# Patient Record
Sex: Female | Born: 1995 | Race: White | Hispanic: No | Marital: Single | Smoking: Never smoker
Health system: Southern US, Community
[De-identification: ages and names within clinical notes are randomized; demographics above are authoritative.]

## PROBLEM LIST (undated history)

## (undated) ENCOUNTER — Inpatient Hospital Stay (HOSPITAL_COMMUNITY): Payer: Self-pay

## (undated) DIAGNOSIS — B999 Unspecified infectious disease: Secondary | ICD-10-CM

## (undated) DIAGNOSIS — Z789 Other specified health status: Secondary | ICD-10-CM

## (undated) DIAGNOSIS — R51 Headache: Secondary | ICD-10-CM

## (undated) DIAGNOSIS — R519 Headache, unspecified: Secondary | ICD-10-CM

## (undated) HISTORY — PX: CYSTECTOMY: SUR359

---
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
## 2014-08-24 NOTE — L&D Delivery Note (Signed)
Patient is 19 y.o. G2P0010 [redacted]w[redacted]d admitted IOL for non-reassuring antenatal testing.    Delivery Note At 8:51 AM a viable female was delivered via Vaginal, Spontaneous Delivery (Presentation: Middle Occiput Anterior).  APGAR: 8, 9; weight pending .   Placenta status: Intact, Spontaneous.  Cord: 3 vessels with the following complications: None.   Anesthesia: Epidural  Episiotomy: None Lacerations: Labial Suture Repair: vicryl 4.0  Mom to postpartum.  Baby to Couplet care / Skin to Skin.  Tarri Abernethy 04/18/2015, 9:37 AM    Upon arrival patient was complete and pushing. She pushed with good maternal effort to deliver a healthy baby girl. Baby delivered without difficulty, was noted to have good tone and place on maternal abdomen for oral suctioning, drying and stimulation. Delayed cord clamping performed. Placenta delivered intact with 3V cord. Vaginal canal and perineum was inspected; labial laceration repaired with 4.0 vicryl. Pitocin was started and uterus massaged until bleeding slowed. Counts of sharps, instruments, and lap pads were all correct.   Tarri Abernethy, MD PGY-1 [facility] Family Medicine     I was gloved and present for entire delivery Agree with note No difficulty with shoulders Laceration repaired with my assistance Baby and mom stable Aviva Signs, CNM

## 2014-10-15 LAB — OB RESULTS CONSOLE ANTIBODY SCREEN: Antibody Screen: NEGATIVE

## 2014-10-15 LAB — OB RESULTS CONSOLE GC/CHLAMYDIA
CHLAMYDIA, DNA PROBE: NEGATIVE
Gonorrhea: NEGATIVE

## 2014-10-15 LAB — OB RESULTS CONSOLE HIV ANTIBODY (ROUTINE TESTING): HIV: NONREACTIVE

## 2014-10-15 LAB — OB RESULTS CONSOLE RUBELLA ANTIBODY, IGM: Rubella: IMMUNE

## 2014-10-15 LAB — OB RESULTS CONSOLE ABO/RH: RH TYPE: POSITIVE

## 2014-10-15 LAB — OB RESULTS CONSOLE HEPATITIS B SURFACE ANTIGEN: HEP B S AG: NEGATIVE

## 2014-10-15 LAB — OB RESULTS CONSOLE RPR: RPR: NONREACTIVE

## 2015-02-10 ENCOUNTER — Encounter (HOSPITAL_COMMUNITY): Payer: Self-pay | Admitting: *Deleted

## 2015-02-10 ENCOUNTER — Inpatient Hospital Stay (HOSPITAL_COMMUNITY)
Admission: AD | Admit: 2015-02-10 | Discharge: 2015-02-10 | Disposition: A | Discharge status: Home / Self Care | Payer: Medicaid Other | Source: Ambulatory Visit | Attending: Obstetrics & Gynecology | Admitting: Obstetrics & Gynecology

## 2015-02-10 DIAGNOSIS — Z3A31 31 weeks gestation of pregnancy: Secondary | ICD-10-CM | POA: Diagnosis not present

## 2015-02-10 DIAGNOSIS — R51 Headache: Secondary | ICD-10-CM | POA: Diagnosis not present

## 2015-02-10 DIAGNOSIS — O26893 Other specified pregnancy related conditions, third trimester: Secondary | ICD-10-CM

## 2015-02-10 DIAGNOSIS — O9989 Other specified diseases and conditions complicating pregnancy, childbirth and the puerperium: Secondary | ICD-10-CM | POA: Diagnosis present

## 2015-02-10 DIAGNOSIS — R519 Headache, unspecified: Secondary | ICD-10-CM

## 2015-02-10 HISTORY — DX: Other specified health status: Z78.9

## 2015-02-10 LAB — URINALYSIS, ROUTINE W REFLEX MICROSCOPIC
Bilirubin Urine: NEGATIVE
Glucose, UA: NEGATIVE mg/dL
Hgb urine dipstick: NEGATIVE
Ketones, ur: NEGATIVE mg/dL
Nitrite: NEGATIVE
Protein, ur: NEGATIVE mg/dL
SPECIFIC GRAVITY, URINE: 1.01 (ref 1.005–1.030)
Urobilinogen, UA: 0.2 mg/dL (ref 0.0–1.0)
pH: 5.5 (ref 5.0–8.0)

## 2015-02-10 LAB — URINE MICROSCOPIC-ADD ON

## 2015-02-10 MED ORDER — BUTALBITAL-APAP-CAFFEINE 50-325-40 MG PO TABS: 1.0000 | ORAL_TABLET | Freq: Four times a day (QID) | ORAL | Status: DC | PRN

## 2015-02-10 MED ORDER — BUTALBITAL-APAP-CAFFEINE 50-325-40 MG PO TABS
2.0000 | ORAL_TABLET | Freq: Once | ORAL | Status: AC
  Administered 2015-02-10: 2 via ORAL
  Filled 2015-02-10: qty 2

## 2015-02-10 NOTE — Discharge Instructions (Signed)
Atypical Migraine CAUSES  When migraine affects the circulation in back of the brain or neck, it can cause atpical migraine.  SYMPTOMS  It occurs most frequently in young women. Symptoms include:  Dizziness.  Double vision.  Loss of balance.  Confusion.  Slurred speech.  Fainting.  Disorientation.  During the severe (acute) headache, some people lose consciousness. Often these patients are mistakenly thought to be intoxicated, under the influence of drugs, or suffering from other conditions. A previous history of migraine is helpful in making the diagnosis.  TREATMENT  Basilar migraines are treated medically the same as all other migraines. HOME CARE INSTRUCTIONS  1. If this is your first diagnosed migraine headache, you may simply choose to wait and watch. You can wait to see if you have another headache before deciding on a further treatment. 2. You may consult your caregiver or do as suggested by the current treating caregiver. 3. Numerous medications can prevent these headaches if they are recurrent or should they become recurrent. Your caregiver can help you with a medication or treatment program that will be helpful to you. 4. If this has been a chronic (long-standing) condition, using continuous narcotics is not recommended. Using long-term narcotics can cause recurrent migraines. Narcotics are a temporary measure only. They are used for the infrequent migraine that fails to respond to all other measures. SEEK IMMEDIATE MEDICAL CARE IF:   You do not get relief from the medications given to you or you have a recurrence of pain.  You have an unexplained oral temperature above 102 F (38.9 C), or as your caregiver suggests.  You have a stiff neck.  You have loss of vision.  You have muscular weakness.  You have loss of muscular control.  You develop severe symptoms different from your first symptoms.  You start losing your balance or have trouble walking.  You feel  faint or pass out. MAKE SURE YOU:   Understand these instructions.  Will watch your condition.  Will get help right away if you are not doing well or get worse. Document Released: 08/10/2005 Document Revised: 11/02/2011 Document Reviewed: 03/28/2008 [facility] Patient Information 2015 [place], [place]. This information is not intended to replace advice given to you by your health care provider. Make sure you discuss any questions you have with your health care provider.  Fetal Movement Counts Patient Name: __________________________________________________ Patient Due Date: ____________________ Performing a fetal movement count is highly recommended in high-risk pregnancies, but it is good for every pregnant woman to do. Your health care provider may ask you to start counting fetal movements at 28 weeks of the pregnancy. Fetal movements often increase:  After eating a full meal.  After physical activity.  After eating or drinking something sweet or cold.  At rest. Pay attention to when you feel the baby is most active. This will help you notice a pattern of your baby's sleep and wake cycles and what factors contribute to an increase in fetal movement. It is important to perform a fetal movement count at the same time each day when your baby is normally most active.  HOW TO COUNT FETAL MOVEMENTS 5. Find a quiet and comfortable area to sit or lie down on your left side. Lying on your left side provides the best blood and oxygen circulation to your baby. 6. Write down the day and time on a sheet of paper or in a journal. 7. Start counting kicks, flutters, swishes, rolls, or jabs in a 2-hour period. You should  feel at least 10 movements within 2 hours. 8. If you do not feel 10 movements in 2 hours, wait 2-3 hours and count again. Look for a change in the pattern or not enough counts in 2 hours. SEEK MEDICAL CARE IF:  You feel less than 10 counts in 2 hours, tried twice.  There is no movement  in over an hour.  The pattern is changing or taking longer each day to reach 10 counts in 2 hours.  You feel the baby is not moving as he or she usually does. Date: ____________ Movements: ____________ Start time: ____________ Doreatha Martin time: ____________  Date: ____________ Movements: ____________ Start time: ____________ Doreatha Martin time: ____________ Date: ____________ Movements: ____________ Start time: ____________ Doreatha Martin time: ____________ Date: ____________ Movements: ____________ Start time: ____________ Doreatha Martin time: ____________ Date: ____________ Movements: ____________ Start time: ____________ Doreatha Martin time: ____________ Date: ____________ Movements: ____________ Start time: ____________ Doreatha Martin time: ____________ Date: ____________ Movements: ____________ Start time: ____________ Doreatha Martin time: ____________ Date: ____________ Movements: ____________ Start time: ____________ Doreatha Martin time: ____________  Date: ____________ Movements: ____________ Start time: ____________ Doreatha Martin time: ____________ Date: ____________ Movements: ____________ Start time: ____________ Doreatha Martin time: ____________ Date: ____________ Movements: ____________ Start time: ____________ Doreatha Martin time: ____________ Date: ____________ Movements: ____________ Start time: ____________ Doreatha Martin time: ____________ Date: ____________ Movements: ____________ Start time: ____________ Doreatha Martin time: ____________ Date: ____________ Movements: ____________ Start time: ____________ Doreatha Martin time: ____________ Date: ____________ Movements: ____________ Start time: ____________ Doreatha Martin time: ____________  Date: ____________ Movements: ____________ Start time: ____________ Doreatha Martin time: ____________ Date: ____________ Movements: ____________ Start time: ____________ Doreatha Martin time: ____________ Date: ____________ Movements: ____________ Start time: ____________ Doreatha Martin time: ____________ Date: ____________ Movements: ____________ Start time: ____________  Doreatha Martin time: ____________ Date: ____________ Movements: ____________ Start time: ____________ Doreatha Martin time: ____________ Date: ____________ Movements: ____________ Start time: ____________ Doreatha Martin time: ____________ Date: ____________ Movements: ____________ Start time: ____________ Doreatha Martin time: ____________  Date: ____________ Movements: ____________ Start time: ____________ Doreatha Martin time: ____________ Date: ____________ Movements: ____________ Start time: ____________ Doreatha Martin time: ____________ Date: ____________ Movements: ____________ Start time: ____________ Doreatha Martin time: ____________ Date: ____________ Movements: ____________ Start time: ____________ Doreatha Martin time: ____________ Date: ____________ Movements: ____________ Start time: ____________ Doreatha Martin time: ____________ Date: ____________ Movements: ____________ Start time: ____________ Doreatha Martin time: ____________ Date: ____________ Movements: ____________ Start time: ____________ Doreatha Martin time: ____________  Date: ____________ Movements: ____________ Start time: ____________ Doreatha Martin time: ____________ Date: ____________ Movements: ____________ Start time: ____________ Doreatha Martin time: ____________ Date: ____________ Movements: ____________ Start time: ____________ Doreatha Martin time: ____________ Date: ____________ Movements: ____________ Start time: ____________ Doreatha Martin time: ____________ Date: ____________ Movements: ____________ Start time: ____________ Doreatha Martin time: ____________ Date: ____________ Movements: ____________ Start time: ____________ Doreatha Martin time: ____________ Date: ____________ Movements: ____________ Start time: ____________ Doreatha Martin time: ____________  Date: ____________ Movements: ____________ Start time: ____________ Doreatha Martin time: ____________ Date: ____________ Movements: ____________ Start time: ____________ Doreatha Martin time: ____________ Date: ____________ Movements: ____________ Start time: ____________ Doreatha Martin time: ____________ Date: ____________  Movements: ____________ Start time: ____________ Doreatha Martin time: ____________ Date: ____________ Movements: ____________ Start time: ____________ Doreatha Martin time: ____________ Date: ____________ Movements: ____________ Start time: ____________ Doreatha Martin time: ____________ Date: ____________ Movements: ____________ Start time: ____________ Doreatha Martin time: ____________  Date: ____________ Movements: ____________ Start time: ____________ Doreatha Martin time: ____________ Date: ____________ Movements: ____________ Start time: ____________ Doreatha Martin time: ____________ Date: ____________ Movements: ____________ Start time: ____________ Doreatha Martin time: ____________ Date: ____________ Movements: ____________ Start time: ____________ Doreatha Martin time: ____________ Date: ____________ Movements: ____________ Start time: ____________ Doreatha Martin time: ____________ Date: ____________ Movements: ____________ Start time: ____________ Doreatha Martin time: ____________ Date: ____________ Movements: ____________ Start  time: ____________ Doreatha Martin time: ____________  Date: ____________ Movements: ____________ Start time: ____________ Doreatha Martin time: ____________ Date: ____________ Movements: ____________ Start time: ____________ Doreatha Martin time: ____________ Date: ____________ Movements: ____________ Start time: ____________ Doreatha Martin time: ____________ Date: ____________ Movements: ____________ Start time: ____________ Doreatha Martin time: ____________ Date: ____________ Movements: ____________ Start time: ____________ Doreatha Martin time: ____________ Date: ____________ Movements: ____________ Start time: ____________ Doreatha Martin time: ____________ Document Released: 09/09/2006 Document Revised: 12/25/2013 Document Reviewed: 06/06/2012 [employer] Patient Information 2015 [employer]. This information is not intended to replace advice given to you by your health care provider. Make sure you discuss any questions you have with your health care provider.

## 2015-02-10 NOTE — MAU Note (Signed)
Patient states that she had a panic / anxiety attack last night and could have hyperventilated.

## 2015-02-10 NOTE — MAU Provider Note (Signed)
Chief Complaint:  Migraine; Numbness; and Eye Problem   First Provider Initiated Contact with Patient 02/10/15 1557      HPI: Chelsea Rogers is a 19 y.o. G2P0010 at [redacted]w[redacted]d who presents to maternity admissions reporting possible migraine HA and blurry vision last night and this morning. HA's were preceded by various brief episodes of numbness in hands, tongue, inner calf on both sides if the body. When Sx started last light she got very panicked and started hyperventilating. Sx worsened after that, but resolved w/ sleep. 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   She rates her HA 5/10 on pain scale. Describes it as sharp, right-sided. Did not try anything to Tx the HA. Has Hx of frequent HA's, but none during the pregnancy. Has never been evaluated for HA's. Does not have PCP.    Denies contractions, leakage of fluid or vaginal bleeding. Good fetal movement.   Pregnancy Course: Uncomplicated. Getting care at [facility] in [place], btu is switching to [place].    Past Medical History: Past Medical History  Diagnosis Date  . Medical history non-contributory     Past obstetric history: OB History  Gravida Para Term Preterm AB SAB TAB Ectopic Multiple Living  2 0   1 1    0    # Outcome Date GA Lbr Len/2nd Weight Sex Delivery Anes PTL Lv  2 Current           1 SAB 11/07/13              Past Surgical History: Past Surgical History  Procedure Laterality Date  . Cyst removal hand  2014    PILONIDAL     Family History: History reviewed. No pertinent family history.  Social History: History  Substance Use Topics  . Smoking status: Never Smoker   . Smokeless tobacco: Never Used  . Alcohol Use: No    Allergies: No Known Allergies  Meds:  Prescriptions prior to admission  Medication Sig Dispense Refill Last Dose  . Prenatal Vit-Fe Fumarate-FA (PRENATAL MULTIVITAMIN) TABS tablet Take 1 tablet by mouth daily.   02/10/2015 at  Unknown time    ROS:  Review of Systems  Constitutional: Negative for fever and chills.  HENT: Negative for congestion and ear pain.   Eyes: Positive for blurred vision and photophobia. Negative for double vision and pain.  Gastrointestinal: Negative for nausea, vomiting and abdominal pain.  Genitourinary:       Neg for VB or LOF  Musculoskeletal: Negative for falls and neck pain.  Neurological: Positive for tingling, sensory change, speech change (one episode of difficulty "finding her words" last night. No problems since then. ) and headaches. Negative for dizziness, focal weakness, seizures, loss of consciousness and weakness.  Psychiatric/Behavioral: The patient is nervous/anxious.     Physical Exam  Blood pressure 120/66, pulse 72, temperature 98.2 F (36.8 C), temperature source Oral, resp. rate 16, last menstrual period 07/08/2014. GENERAL: Well-developed, well-nourished female in no acute distress.  HEAD: Symmetric facial mvmts.  HEART: normal rate RESP: normal effort GI: Abd soft, non-tender, gravid appropriate for gestational age.  MS: Extremities nontender, no edema, normal ROM.  NEURO: Alert and oriented x 4. Grossly normal sensation of bilat upper and lower extremities and face. Symmetric facial mvmt. Normal speech and gait.  PSYCHIATRIC: Normal mood and affect  GU: Deferred   FHT:  Baseline 130 , moderate variability, accelerations present, no decelerations Contractions: none  Labs: Results for orders placed or performed during the hospital encounter of 02/10/15 (from the past 24 hour(s))  Urinalysis, Routine w reflex microscopic (not at [facility])     Status: Abnormal   Collection Time: 02/10/15  4:10 PM  Result Value Ref Range   Color, Urine YELLOW YELLOW   APPearance CLEAR CLEAR   Specific Gravity, Urine 1.010 1.005 - 1.030   pH 5.5 5.0 - 8.0   Glucose, UA NEGATIVE NEGATIVE mg/dL   Hgb urine dipstick NEGATIVE NEGATIVE   Bilirubin Urine NEGATIVE NEGATIVE    Ketones, ur NEGATIVE NEGATIVE mg/dL   Protein, ur NEGATIVE NEGATIVE mg/dL   Urobilinogen, UA 0.2 0.0 - 1.0 mg/dL   Nitrite NEGATIVE NEGATIVE   Leukocytes, UA SMALL (A) NEGATIVE  Urine microscopic-add on     Status: Abnormal   Collection Time: 02/10/15  4:10 PM  Result Value Ref Range   Squamous Epithelial / LPF FEW (A) RARE   WBC, UA 3-6 <3 WBC/hpf   Bacteria, UA FEW (A) RARE   Urine-Other MUCOUS PRESENT     Imaging:  No results found.  MAU Course: UA, Fioricet, PO fluids. Discussed pt w/ Dr. Macon Large. Very low suspicion for stroke due to brief episodes of numbness of both sides of the body. May be either R/T panic attack last night or atypical migraine. Also low suspicion for Pre-E due to normal BP, absence of proteinuria.    HA 1/10 on pain scale. Pt laughing w/ family when CNM returned. Lights on. Pt in NAD. Reports one brief episode of numbness of left palm while in MAU that has since resolved. Support person notes that pt had been texting on her phone for a while before the numbness occurred. Pt became anxious again. Discussed that there is no apparent neuro emergency, no indication for head CT at this time, but that pt could F/U w/ Neuro OP. Hand numbness may he carple tunnel syndrome.   Assessment: 1. Headache in pregnancy, antepartum, third trimester    Plan: Discharge home in stable condition per consult w/ Dr. Macon Large.  Preterm labor precautions and fetal kick counts. Stroke and Pre-E precautions.  HA comfort measures.      Follow-up Information    Follow up with [facility] [place].   Why:  Routine prenatal visit   Contact information:   [address] [place] [address] [phone]      Follow up with [facility] [place] [facility].   Why:  As needed in emergencies   Contact information:   [address] [id number] mc [place] [address] [phone]        Medication List    TAKE these  medications        butalbital-acetaminophen-caffeine 50-325-40 MG per tablet  Commonly known as:  FIORICET  Take 1-2 tablets by mouth every 6 (six) hours as needed for headache.     prenatal multivitamin Tabs tablet  Take 1 tablet by mouth daily.        [place], CNM 02/10/2015 5:40 PM

## 2015-02-10 NOTE — MAU Note (Signed)
Patient presents at [redacted] weeks gestation with c/o a migraine headache last night and for a couple of hours today; right hand / left leg / foot / face / tongue numbness and visual spots last night and left hand / face / tongue numbness and visual spots today. States fetus is active. Denies bleeding and fluid gushes.

## 2015-03-21 LAB — OB RESULTS CONSOLE GBS: GBS: NEGATIVE

## 2015-04-15 ENCOUNTER — Inpatient Hospital Stay (HOSPITAL_COMMUNITY)
Admission: AD | Admit: 2015-04-15 | Discharge: 2015-04-15 | Disposition: A | Discharge status: Home / Self Care | Payer: Medicaid Other | Source: Ambulatory Visit | Attending: Family Medicine | Admitting: Family Medicine

## 2015-04-15 ENCOUNTER — Other Ambulatory Visit (HOSPITAL_COMMUNITY): Payer: Self-pay | Admitting: Physician Assistant

## 2015-04-15 ENCOUNTER — Encounter (HOSPITAL_COMMUNITY): Payer: Self-pay

## 2015-04-15 DIAGNOSIS — O26893 Other specified pregnancy related conditions, third trimester: Secondary | ICD-10-CM | POA: Insufficient documentation

## 2015-04-15 DIAGNOSIS — O480 Post-term pregnancy: Secondary | ICD-10-CM

## 2015-04-15 DIAGNOSIS — Z3A40 40 weeks gestation of pregnancy: Secondary | ICD-10-CM | POA: Insufficient documentation

## 2015-04-15 DIAGNOSIS — Z79899 Other long term (current) drug therapy: Secondary | ICD-10-CM | POA: Diagnosis not present

## 2015-04-15 DIAGNOSIS — N898 Other specified noninflammatory disorders of vagina: Secondary | ICD-10-CM | POA: Insufficient documentation

## 2015-04-15 DIAGNOSIS — O4693 Antepartum hemorrhage, unspecified, third trimester: Secondary | ICD-10-CM

## 2015-04-15 NOTE — MAU Note (Signed)
Pt reports gush of clear water at 1700,  ?contractions.

## 2015-04-15 NOTE — MAU Provider Note (Signed)
History     CSN: [id number]  Arrival date and time: 04/15/15 9563   First Provider Initiated Contact with Patient 04/15/15 2240      No chief complaint on file.  This is a 19 y.o. female at [redacted]w[redacted]d who presents for possible leaking of membranes. States had a gush of clear water at 5pm.  Happened only once. Feels an occasional contraction, not sure. Denies bleeding. Reports good fetal movement.    Vaginal Discharge The patient's primary symptoms include vaginal discharge. This is a new problem. The current episode started today. The problem has been resolved. She is pregnant. Pertinent negatives include no abdominal pain, back pain, chills, constipation, diarrhea, dysuria, fever, nausea or vomiting. The vaginal discharge was clear and watery. There has been no bleeding. She has not been passing clots. She has not been passing tissue. Nothing aggravates the symptoms. She has tried nothing for the symptoms.   RN Note:  Expand All Collapse All   Pt reports gush of clear water at 1700, ?contractions.           OB History    Gravida Para Term Preterm AB TAB SAB Ectopic Multiple Living   0 1      Past Medical History  Diagnosis Date  . Medical history non-contributory     Past Surgical History  Procedure Laterality Date  . Cyst removal hand  2014    PILONIDAL    History reviewed. No pertinent family history.  Social History  Substance Use Topics  . Smoking status: Never Smoker   . Smokeless tobacco: Never Used  . Alcohol Use: No    Allergies: No Known Allergies  Prescriptions prior to admission  Medication Sig Dispense Refill Last Dose  . acetaminophen (TYLENOL) 500 MG tablet Take 1,000 mg by mouth every 6 (six) hours as needed for headache.    Past Month at Unknown time  . amoxicillin (AMOXIL) 500 MG capsule Take 500 mg by mouth 3 (three) times daily.   04/15/2015 at Unknown time  . cetirizine-pseudoephedrine (ZYRTEC-D) 5-120 MG per tablet Take 1  tablet by mouth daily.   Past Week at Unknown time  . Prenatal Vit-Fe Fumarate-FA (PRENATAL MULTIVITAMIN) TABS tablet Take 1 tablet by mouth daily.   04/15/2015 at Unknown time  . butalbital-acetaminophen-caffeine (FIORICET) 50-325-40 MG per tablet Take 1-2 tablets by mouth every 6 (six) hours as needed for headache. (Patient not taking: Reported on 04/15/2015) 20 tablet 1 Not Taking at Unknown time    Review of Systems  Constitutional: Negative for fever, chills and malaise/fatigue.  Gastrointestinal: Negative for nausea, vomiting, abdominal pain, diarrhea and constipation.  Genitourinary: Positive for vaginal discharge. Negative for dysuria.  Musculoskeletal: Negative for myalgias, back pain and neck pain.  Neurological: Negative for dizziness.  Other systems negative  Physical Exam   Blood pressure 132/72, pulse 88, temperature 98.3 F (36.8 C), temperature source Oral, resp. rate 15, height  (1.626 m), weight 151 lb (68.493 kg), last menstrual period 07/08/2014, SpO2 98 %.  Physical Exam  Constitutional: She is oriented to person, place, and time. She appears well-developed and well-nourished. No distress.  HENT:  Head: Normocephalic.  Cardiovascular: Normal rate and regular rhythm.   Respiratory: Effort normal. No respiratory distress.  GI: Soft. She exhibits no distension and no mass. There is no tenderness. There is no rebound and no guarding.  Genitourinary:  Sterile speculum exam:  NO pooling or ferning Cervix 1/70/-2 with no change  after one hour  Musculoskeletal: Normal range of motion.  Neurological: She is alert and oriented to person, place, and time.  Skin: Skin is warm and dry.  Psychiatric: She has a normal mood and affect.  FHR reactive Occasional contractions  MAU Course  Procedures  MDM Sterile speculum exam done which ruled out rupture of membranes  Assessment and Plan  A:  SIUP at [redacted]w[redacted]d       Suspected leaking of fluid, no evidence of ruptured  membranes       Not in labor       Reassuring fetal heart rate tracing, Category I  P:   Discharge home       Labor precautions  [facility] 04/15/2015, 10:40 PM

## 2015-04-15 NOTE — Discharge Instructions (Signed)
Braxton Hicks Contractions °Contractions of the uterus can occur throughout pregnancy. Contractions are not always a sign that you are in labor.  °WHAT ARE BRAXTON HICKS CONTRACTIONS?  °Contractions that occur before labor are called Braxton Hicks contractions, or false labor. Toward the end of pregnancy (32-34 weeks), these contractions can develop more often and may become more forceful. This is not true labor because these contractions do not result in opening (dilatation) and thinning of the cervix. They are sometimes difficult to tell apart from true labor because these contractions can be forceful and people have different pain tolerances. You should not feel embarrassed if you go to the hospital with false labor. Sometimes, the only way to tell if you are in true labor is for your health care provider to look for changes in the cervix. °If there are no prenatal problems or other health problems associated with the pregnancy, it is completely safe to be sent home with false labor and await the onset of true labor. °HOW CAN YOU TELL THE DIFFERENCE BETWEEN TRUE AND FALSE LABOR? °False Labor °· The contractions of false labor are usually shorter and not as hard as those of true labor.   °· The contractions are usually irregular.   °· The contractions are often felt in the front of the lower abdomen and in the groin.   °· The contractions may go away when you walk around or change positions while lying down.   °· The contractions get weaker and are shorter lasting as time goes on.   °· The contractions do not usually become progressively stronger, regular, and closer together as with true labor.   °True Labor °1. Contractions in true labor last 30-70 seconds, become very regular, usually become more intense, and increase in frequency.   °2. The contractions do not go away with walking.   °3. The discomfort is usually felt in the top of the uterus and spreads to the lower abdomen and low back.   °4. True labor can  be determined by your health care provider with an exam. This will show that the cervix is dilating and getting thinner.   °WHAT TO REMEMBER °· Keep up with your usual exercises and follow other instructions given by your health care provider.   °· Take medicines as directed by your health care provider.   °· Keep your regular prenatal appointments.   °· Eat and drink lightly if you think you are going into labor.   °· If Braxton Hicks contractions are making you uncomfortable:   °· Change your position from lying down or resting to walking, or from walking to resting.   °· Sit and rest in a tub of warm water.   °· Drink 2-3 glasses of water. Dehydration may cause these contractions.   °· Do slow and deep breathing several times an hour.   °WHEN SHOULD I SEEK IMMEDIATE MEDICAL CARE? °Seek immediate medical care if: °· Your contractions become stronger, more regular, and closer together.   °· You have fluid leaking or gushing from your vagina.   °· You have a fever.   °· You pass blood-tinged mucus.   °· You have vaginal bleeding.   °· You have continuous abdominal pain.   °· You have low back pain that you never had before.   °· You feel your baby's head pushing down and causing pelvic pressure.   °· Your baby is not moving as much as it used to.   °Document Released: 08/10/2005 Document Revised: 08/15/2013 Document Reviewed: 05/22/2013 °[employer]® Patient Information ©2015 [employer]. This information is not intended to replace advice given to you by your health care   provider. Make sure you discuss any questions you have with your health care provider. ° °Fetal Movement Counts °Patient Name: __________________________________________________ Patient Due Date: ____________________ °Performing a fetal movement count is highly recommended in high-risk pregnancies, but it is good for every pregnant woman to do. Your health care provider may ask you to start counting fetal movements at 28 weeks of the pregnancy. Fetal  movements often increase: °· After eating a full meal. °· After physical activity. °· After eating or drinking something sweet or cold. °· At rest. °Pay attention to when you feel the baby is most active. This will help you notice a pattern of your baby's sleep and wake cycles and what factors contribute to an increase in fetal movement. It is important to perform a fetal movement count at the same time each day when your baby is normally most active.  °HOW TO COUNT FETAL MOVEMENTS °5. Find a quiet and comfortable area to sit or lie down on your left side. Lying on your left side provides the best blood and oxygen circulation to your baby. °6. Write down the day and time on a sheet of paper or in a journal. °7. Start counting kicks, flutters, swishes, rolls, or jabs in a 2-hour period. You should feel at least 10 movements within 2 hours. °8. If you do not feel 10 movements in 2 hours, wait 2-3 hours and count again. Look for a change in the pattern or not enough counts in 2 hours. °SEEK MEDICAL CARE IF: °· You feel less than 10 counts in 2 hours, tried twice. °· There is no movement in over an hour. °· The pattern is changing or taking longer each day to reach 10 counts in 2 hours. °· You feel the baby is not moving as he or she usually does. °Date: ____________ Movements: ____________ Start time: ____________ Finish time: ____________  °Date: ____________ Movements: ____________ Start time: ____________ Finish time: ____________ °Date: ____________ Movements: ____________ Start time: ____________ Finish time: ____________ °Date: ____________ Movements: ____________ Start time: ____________ Finish time: ____________ °Date: ____________ Movements: ____________ Start time: ____________ Finish time: ____________ °Date: ____________ Movements: ____________ Start time: ____________ Finish time: ____________ °Date: ____________ Movements: ____________ Start time: ____________ Finish time: ____________ °Date: ____________  Movements: ____________ Start time: ____________ Finish time: ____________  °Date: ____________ Movements: ____________ Start time: ____________ Finish time: ____________ °Date: ____________ Movements: ____________ Start time: ____________ Finish time: ____________ °Date: ____________ Movements: ____________ Start time: ____________ Finish time: ____________ °Date: ____________ Movements: ____________ Start time: ____________ Finish time: ____________ °Date: ____________ Movements: ____________ Start time: ____________ Finish time: ____________ °Date: ____________ Movements: ____________ Start time: ____________ Finish time: ____________ °Date: ____________ Movements: ____________ Start time: ____________ Finish time: ____________  °Date: ____________ Movements: ____________ Start time: ____________ Finish time: ____________ °Date: ____________ Movements: ____________ Start time: ____________ Finish time: ____________ °Date: ____________ Movements: ____________ Start time: ____________ Finish time: ____________ °Date: ____________ Movements: ____________ Start time: ____________ Finish time: ____________ °Date: ____________ Movements: ____________ Start time: ____________ Finish time: ____________ °Date: ____________ Movements: ____________ Start time: ____________ Finish time: ____________ °Date: ____________ Movements: ____________ Start time: ____________ Finish time: ____________  °Date: ____________ Movements: ____________ Start time: ____________ Finish time: ____________ °Date: ____________ Movements: ____________ Start time: ____________ Finish time: ____________ °Date: ____________ Movements: ____________ Start time: ____________ Finish time: ____________ °Date: ____________ Movements: ____________ Start time: ____________ Finish time: ____________ °Date: ____________ Movements: ____________ Start time: ____________ Finish time: ____________ °Date: ____________ Movements: ____________ Start time:  ____________ Finish time: ____________ °Date: ____________ Movements:   ____________ Start time: ____________ Finish time: ____________  °Date: ____________ Movements: ____________ Start time: ____________ Finish time: ____________ °Date: ____________ Movements: ____________ Start time: ____________ Finish time: ____________ °Date: ____________ Movements: ____________ Start time: ____________ Finish time: ____________ °Date: ____________ Movements: ____________ Start time: ____________ Finish time: ____________ °Date: ____________ Movements: ____________ Start time: ____________ Finish time: ____________ °Date: ____________ Movements: ____________ Start time: ____________ Finish time: ____________ °Date: ____________ Movements: ____________ Start time: ____________ Finish time: ____________  °Date: ____________ Movements: ____________ Start time: ____________ Finish time: ____________ °Date: ____________ Movements: ____________ Start time: ____________ Finish time: ____________ °Date: ____________ Movements: ____________ Start time: ____________ Finish time: ____________ °Date: ____________ Movements: ____________ Start time: ____________ Finish time: ____________ °Date: ____________ Movements: ____________ Start time: ____________ Finish time: ____________ °Date: ____________ Movements: ____________ Start time: ____________ Finish time: ____________ °Date: ____________ Movements: ____________ Start time: ____________ Finish time: ____________  °Date: ____________ Movements: ____________ Start time: ____________ Finish time: ____________ °Date: ____________ Movements: ____________ Start time: ____________ Finish time: ____________ °Date: ____________ Movements: ____________ Start time: ____________ Finish time: ____________ °Date: ____________ Movements: ____________ Start time: ____________ Finish time: ____________ °Date: ____________ Movements: ____________ Start time: ____________ Finish time: ____________ °Date:  ____________ Movements: ____________ Start time: ____________ Finish time: ____________ °Date: ____________ Movements: ____________ Start time: ____________ Finish time: ____________  °Date: ____________ Movements: ____________ Start time: ____________ Finish time: ____________ °Date: ____________ Movements: ____________ Start time: ____________ Finish time: ____________ °Date: ____________ Movements: ____________ Start time: ____________ Finish time: ____________ °Date: ____________ Movements: ____________ Start time: ____________ Finish time: ____________ °Date: ____________ Movements: ____________ Start time: ____________ Finish time: ____________ °Date: ____________ Movements: ____________ Start time: ____________ Finish time: ____________ °Document Released: 09/09/2006 Document Revised: 12/25/2013 Document Reviewed: 06/06/2012 °[employer]® Patient Information ©2015 [employer]. This information is not intended to replace advice given to you by your health care provider. Make sure you discuss any questions you have with your health care provider. ° °

## 2015-04-16 ENCOUNTER — Encounter (HOSPITAL_COMMUNITY): Payer: Self-pay | Admitting: *Deleted

## 2015-04-16 ENCOUNTER — Telehealth (HOSPITAL_COMMUNITY): Payer: Self-pay | Admitting: *Deleted

## 2015-04-16 NOTE — Telephone Encounter (Signed)
Preadmission screen  

## 2015-04-17 ENCOUNTER — Inpatient Hospital Stay (HOSPITAL_COMMUNITY): Payer: Medicaid Other | Admitting: Anesthesiology

## 2015-04-17 ENCOUNTER — Ambulatory Visit (HOSPITAL_COMMUNITY)
Admission: RE | Admit: 2015-04-17 | Discharge: 2015-04-17 | Disposition: A | Discharge status: Home / Self Care | Payer: Medicaid Other | Source: Ambulatory Visit | Attending: Physician Assistant | Admitting: Physician Assistant

## 2015-04-17 ENCOUNTER — Inpatient Hospital Stay (HOSPITAL_COMMUNITY)
Admission: AD | Admit: 2015-04-17 | Discharge: 2015-04-19 | DRG: 775 | Disposition: A | Discharge status: Home / Self Care | Payer: Medicaid Other | Source: Ambulatory Visit | Attending: Obstetrics and Gynecology | Admitting: Obstetrics and Gynecology

## 2015-04-17 ENCOUNTER — Encounter (HOSPITAL_COMMUNITY): Payer: Self-pay | Admitting: *Deleted

## 2015-04-17 DIAGNOSIS — O480 Post-term pregnancy: Principal | ICD-10-CM | POA: Diagnosis present

## 2015-04-17 DIAGNOSIS — Z825 Family history of asthma and other chronic lower respiratory diseases: Secondary | ICD-10-CM | POA: Diagnosis not present

## 2015-04-17 DIAGNOSIS — Z833 Family history of diabetes mellitus: Secondary | ICD-10-CM

## 2015-04-17 DIAGNOSIS — R51 Headache: Secondary | ICD-10-CM | POA: Diagnosis present

## 2015-04-17 DIAGNOSIS — O4693 Antepartum hemorrhage, unspecified, third trimester: Secondary | ICD-10-CM

## 2015-04-17 DIAGNOSIS — Z3A40 40 weeks gestation of pregnancy: Secondary | ICD-10-CM | POA: Diagnosis present

## 2015-04-17 DIAGNOSIS — Z818 Family history of other mental and behavioral disorders: Secondary | ICD-10-CM

## 2015-04-17 DIAGNOSIS — Z8249 Family history of ischemic heart disease and other diseases of the circulatory system: Secondary | ICD-10-CM

## 2015-04-17 HISTORY — DX: Headache: R51

## 2015-04-17 HISTORY — DX: Unspecified infectious disease: B99.9

## 2015-04-17 HISTORY — DX: Headache, unspecified: R51.9

## 2015-04-17 LAB — ABO/RH: ABO/RH(D): B POS

## 2015-04-17 LAB — CBC
HCT: 38.2 % (ref 36.0–46.0)
Hemoglobin: 12.8 g/dL (ref 12.0–15.0)
MCH: 27.2 pg (ref 26.0–34.0)
MCHC: 33.5 g/dL (ref 30.0–36.0)
MCV: 81.3 fL (ref 78.0–100.0)
PLATELETS: 244 10*3/uL (ref 150–400)
RBC: 4.7 MIL/uL (ref 3.87–5.11)
RDW: 15.2 % (ref 11.5–15.5)
WBC: 10.3 10*3/uL (ref 4.0–10.5)

## 2015-04-17 LAB — TYPE AND SCREEN
ABO/RH(D): B POS
Antibody Screen: NEGATIVE

## 2015-04-17 MED ORDER — OXYTOCIN 40 UNITS IN LACTATED RINGERS INFUSION - SIMPLE MED
1.0000 m[IU]/min | INTRAVENOUS | Status: DC
  Administered 2015-04-17: 2 m[IU]/min via INTRAVENOUS

## 2015-04-17 MED ORDER — PHENYLEPHRINE 40 MCG/ML (10ML) SYRINGE FOR IV PUSH (FOR BLOOD PRESSURE SUPPORT)
80.0000 ug | PREFILLED_SYRINGE | INTRAVENOUS | Status: DC | PRN
  Filled 2015-04-17: qty 20

## 2015-04-17 MED ORDER — OXYCODONE-ACETAMINOPHEN 5-325 MG PO TABS: 2.0000 | ORAL_TABLET | ORAL | Status: DC | PRN

## 2015-04-17 MED ORDER — TERBUTALINE SULFATE 1 MG/ML IJ SOLN: 0.2500 mg | Freq: Once | INTRAMUSCULAR | Status: DC | PRN

## 2015-04-17 MED ORDER — OXYCODONE-ACETAMINOPHEN 5-325 MG PO TABS: 1.0000 | ORAL_TABLET | ORAL | Status: DC | PRN

## 2015-04-17 MED ORDER — EPHEDRINE 5 MG/ML INJ: 10.0000 mg | INTRAVENOUS | Status: DC | PRN

## 2015-04-17 MED ORDER — CITRIC ACID-SODIUM CITRATE 334-500 MG/5ML PO SOLN: 30.0000 mL | ORAL | Status: DC | PRN

## 2015-04-17 MED ORDER — LACTATED RINGERS IV SOLN
INTRAVENOUS | Status: DC
  Administered 2015-04-17 – 2015-04-18 (×2): via INTRAVENOUS

## 2015-04-17 MED ORDER — LIDOCAINE HCL (PF) 1 % IJ SOLN
INTRAMUSCULAR | Status: DC | PRN
  Administered 2015-04-17 (×2): 5 mL

## 2015-04-17 MED ORDER — BUTORPHANOL TARTRATE 1 MG/ML IJ SOLN
INTRAMUSCULAR | Status: AC
  Administered 2015-04-17: 16:00:00
  Filled 2015-04-17: qty 1

## 2015-04-17 MED ORDER — FENTANYL 2.5 MCG/ML BUPIVACAINE 1/10 % EPIDURAL INFUSION (WH - ANES)
INTRAMUSCULAR | Status: DC | PRN
  Administered 2015-04-17: 14 mL/h via EPIDURAL

## 2015-04-17 MED ORDER — OXYTOCIN 40 UNITS IN LACTATED RINGERS INFUSION - SIMPLE MED
62.5000 mL/h | INTRAVENOUS | Status: DC
  Administered 2015-04-18: 999 mL/h via INTRAVENOUS
  Filled 2015-04-17: qty 1000

## 2015-04-17 MED ORDER — OXYTOCIN BOLUS FROM INFUSION: 500.0000 mL | INTRAVENOUS | Status: DC

## 2015-04-17 MED ORDER — ACETAMINOPHEN 325 MG PO TABS: 650.0000 mg | ORAL_TABLET | ORAL | Status: DC | PRN

## 2015-04-17 MED ORDER — FENTANYL 2.5 MCG/ML BUPIVACAINE 1/10 % EPIDURAL INFUSION (WH - ANES)
14.0000 mL/h | INTRAMUSCULAR | Status: DC | PRN
  Administered 2015-04-17 – 2015-04-18 (×2): 14 mL/h via EPIDURAL
  Filled 2015-04-17 (×2): qty 125

## 2015-04-17 MED ORDER — LACTATED RINGERS IV SOLN
500.0000 mL | INTRAVENOUS | Status: DC | PRN
  Administered 2015-04-17 – 2015-04-18 (×2): 500 mL via INTRAVENOUS

## 2015-04-17 MED ORDER — DIPHENHYDRAMINE HCL 50 MG/ML IJ SOLN: 12.5000 mg | INTRAMUSCULAR | Status: DC | PRN

## 2015-04-17 MED ORDER — LIDOCAINE HCL (PF) 1 % IJ SOLN
30.0000 mL | INTRAMUSCULAR | Status: DC | PRN
  Filled 2015-04-17: qty 30

## 2015-04-17 MED ORDER — FENTANYL 2.5 MCG/ML BUPIVACAINE 1/10 % EPIDURAL INFUSION (WH - ANES): 14.0000 mL/h | INTRAMUSCULAR | Status: DC | PRN

## 2015-04-17 MED ORDER — ONDANSETRON HCL 4 MG/2ML IJ SOLN
4.0000 mg | Freq: Four times a day (QID) | INTRAMUSCULAR | Status: DC | PRN
  Administered 2015-04-17 – 2015-04-18 (×2): 4 mg via INTRAVENOUS
  Filled 2015-04-17 (×2): qty 2

## 2015-04-17 MED ORDER — FENTANYL CITRATE (PF) 100 MCG/2ML IJ SOLN
100.0000 ug | INTRAMUSCULAR | Status: DC | PRN
  Administered 2015-04-17 (×2): 100 ug via INTRAVENOUS
  Filled 2015-04-17 (×2): qty 2

## 2015-04-17 NOTE — Anesthesia Preprocedure Evaluation (Signed)
Anesthesia Evaluation  Patient identified by MRN, date of birth, ID band Patient awake and Patient confused    Reviewed: Allergy & Precautions, H&P , NPO status , Patient's Chart, lab work & pertinent test results  Airway Mallampati: II       Dental   Pulmonary  breath sounds clear to auscultation  Pulmonary exam normal       Cardiovascular Exercise Tolerance: Good Normal cardiovascular examRhythm:regular Rate:Normal     Neuro/Psych    GI/Hepatic   Endo/Other    Renal/GU      Musculoskeletal   Abdominal   Peds  Hematology   Anesthesia Other Findings   Reproductive/Obstetrics (+) Pregnancy                             Anesthesia Physical Anesthesia Plan  ASA: II  Anesthesia Plan:    Post-op Pain Management:    Induction:   Airway Management Planned:   Additional Equipment:   Intra-op Plan:   Post-operative Plan:   Informed Consent: I have reviewed the patients History and Physical, chart, labs and discussed the procedure including the risks, benefits and alternatives for the proposed anesthesia with the patient or authorized representative who has indicated his/her understanding and acceptance.     Plan Discussed with:   Anesthesia Plan Comments:         Anesthesia Quick Evaluation  

## 2015-04-17 NOTE — Anesthesia Procedure Notes (Signed)
Epidural Patient location during procedure: OB Start time: 04/17/2015 10:02 PM End time: 04/17/2015 10:16 PM  Staffing Anesthesiologist: Sebastian Ache  Preanesthetic Checklist Completed: patient identified, site marked, surgical consent, pre-op evaluation, timeout performed, IV checked, risks and benefits discussed and monitors and equipment checked  Epidural Patient position: sitting Prep: site prepped and draped and DuraPrep Patient monitoring: heart rate, continuous pulse ox and blood pressure Approach: midline Location: L3-L4 Injection technique: LOR air  Needle:  Needle type: Tuohy  Needle gauge: 17 G Needle length: 9 cm and 9 Needle insertion depth: 6 cm Catheter type: closed end flexible Catheter size: 20 Guage Catheter at skin depth: 14 cm Test dose: negative  Assessment Events: blood not aspirated, injection not painful, no injection resistance, negative IV test and no paresthesia  Additional Notes   Patient tolerated the insertion well without complications.Reason for block:procedure for pain

## 2015-04-17 NOTE — MAU Note (Signed)
Stated bleeding this am 1045 after using BR, bright red blood with one small dark red clot,

## 2015-04-17 NOTE — MAU Provider Note (Signed)
History   [id number]   Chief Complaint  Patient presents with  . Vaginal Bleeding    HPI Chelsea Rogers is a 19 y.o. female  G2P0010 here with report of vaginal bleeding that started around 1030.  Bleeding is described as heavier than a period.  Also reports abdominal pain that started approximately one hour after bleeding.  Pain is described as constant and rated a 8/10.  +constipation.  All other systems wnl.    Patient's last menstrual period was 07/08/2014.  OB History  Gravida Para Term Preterm AB SAB TAB Ectopic Multiple Living  2 0   1 1    0    # Outcome Date GA Lbr Len/2nd Weight Sex Delivery Anes PTL Lv  2 Current           1 SAB 11/07/13              Past Medical History  Diagnosis Date  . Medical history non-contributory   . Headache   . Infection     UTI    Family History  Problem Relation Age of Onset  . Diabetes Mother   . Mental illness Father     bipolar  . Mental illness Brother     bipolar  . Mental illness Paternal Aunt     bipolar  . Heart disease Maternal Grandmother   . Asthma Maternal Grandmother   . Hypertension Maternal Grandfather   . Aortic aneurysm Maternal Grandfather   . Heart disease Paternal Grandfather     Social History   Social History  . Marital Status: Single    Spouse Name: N/A  . Number of Children: N/A  . Years of Education: N/A   Social History Main Topics  . Smoking status: Never Smoker   . Smokeless tobacco: Never Used  . Alcohol Use: No  . Drug Use: No  . Sexual Activity: Yes    Birth Control/ Protection: None   Other Topics Concern  . None   Social History Narrative    No Known Allergies  No current facility-administered medications on file prior to encounter.   Current Outpatient Prescriptions on File Prior to Encounter  Medication Sig Dispense Refill  . Prenatal Vit-Fe Fumarate-FA (PRENATAL MULTIVITAMIN) TABS tablet Take 1 tablet by mouth daily.    . butalbital-acetaminophen-caffeine  (FIORICET) 50-325-40 MG per tablet Take 1-2 tablets by mouth every 6 (six) hours as needed for headache. (Patient not taking: Reported on 04/15/2015) 20 tablet 1     Review of Systems  Constitutional: Negative for fever.  Gastrointestinal: Positive for abdominal pain (contractions) and constipation. Negative for nausea and vomiting.  Genitourinary: Negative for dysuria, flank pain and vaginal bleeding.  All other systems reviewed and are negative.    Physical Exam   Filed Vitals:   04/17/15 1112  BP: 129/83  Temp: 98.1 F (36.7 C)  TempSrc: Oral  Resp: 20  Height: 5\' 4"  (1.626 m)  Weight: 69.4 kg (153 lb)  SpO2: 97%    Physical Exam  Constitutional: She is oriented to person, place, and time. She appears well-developed and well-nourished.  Appears uncomfortable  HENT:  Head: Normocephalic.  Neck: Normal range of motion. Neck supple.  Cardiovascular: Normal rate, regular rhythm and normal heart sounds.   Respiratory: Effort normal and breath sounds normal. No respiratory distress.  GI: Soft. There is no tenderness.  Genitourinary: There is no lesion or injury on the right labia. There is no lesion or injury on the left labia. There  is bleeding (mucusy dark red blood coming from cervical os; moderate amount) in the vagina. Vaginal discharge (mucusy) found.  Musculoskeletal: Normal range of motion. She exhibits no edema.  Neurological: She is alert and oriented to person, place, and time.  Skin: Skin is warm and dry.   Dilation: 1.5 Exam by:: Margarita Mail CNM   MAU Course  Procedures  MDM Ultrasound ordered BPP 6/8; AFI 11  1500 Consulted with Dr. Jolayne Panther > reviewed HPI/exam/ultrasound results > admit to [facility]  Assessment and Plan  19 y.o. G2P0010 at [redacted]w[redacted]d IUP Vaginal Bleeding in Pregnancy  BPP 6/8 GBS Negative (07/28 0000)  Admit to [facility] Report given to Dr. Heloise Ochoa, CNM  04/17/2015 3:02 PM

## 2015-04-17 NOTE — Progress Notes (Signed)
Labor Progress Note  Chelsea Rogers is a 19 y.o. G2P0010 at [redacted]w[redacted]d  admitted for induction of labor due to non-reassuring antenatal testing.  S: patient doing well s/p epidural. Still uncomfortable per patient but not as bad as it was.   O:  BP 143/73 mmHg  Pulse 57  Temp(Src) 98.2 F (36.8 C) (Oral)  Resp 16  Ht  (1.626 m)  Wt 153 lb (69.4 kg)  BMI 26.25 kg/m2  SpO2 100%  LMP 07/08/2014  FHT:  FHR: 125 bpm, variability: moderate,  accelerations:  Present,  decelerations:  Absent UC:   regular, every 1-2 minutes SVE:   Dilation: 7 Effacement (%): 90, 100 Station: -2, -1 Exam by:: Miachel Roux RN   Labs: Lab Results  Component Value Date   WBC 10.3 04/17/2015   HGB 12.8 04/17/2015   HCT 38.2 04/17/2015   MCV 81.3 04/17/2015   PLT 244 04/17/2015    Assessment / Plan: 19 y.o. G2P0010 [redacted]w[redacted]d in active labor  Labor: Progressing normally Fetal Wellbeing:  Category I Pain Control:  Epidural Anticipated MOD:  NSVD  Expectant management   Caryl Ada, DO 04/17/2015, 11:21 PM PGY-2, [facility]

## 2015-04-17 NOTE — H&P (Signed)
LABOR ADMISSION HISTORY AND PHYSICAL  Chelsea Rogers is a 19 y.o. female G2P0010 with IUP at [redacted]w[redacted]d by LMP presenting for augmentation of labor. Patient initially presented to [facility] today for vaginal bleeding, and was also found to have BPP 6/8 with no points for fetal breathing. She reports +FM, +contractions, No LOF, no blurry vision, headaches or peripheral edema, and RUQ pain.   She plans to breast feed.   Dating: By LMP (07/08/2014) --->  Estimated Date of Delivery: 04/14/15  Sono:    , CWD, normal anatomy, cephalic presentation   Prenatal History/Complications:  Past Medical History: Past Medical History  Diagnosis Date  . Medical history non-contributory   . Headache   . Infection     UTI    Past Surgical History: Past Surgical History  Procedure Laterality Date  . Cystectomy      back    Obstetrical History: OB History    Gravida Para Term Preterm AB TAB SAB Ectopic Multiple Living   2 0   1  1   0      Social History: Social History   Social History  . Marital Status: Single    Spouse Name: N/A  . Number of Children: N/A  . Years of Education: N/A   Social History Main Topics  . Smoking status: Never Smoker   . Smokeless tobacco: Never Used  . Alcohol Use: No  . Drug Use: No  . Sexual Activity: Yes    Birth Control/ Protection: None   Other Topics Concern  . None   Social History Narrative    Family History: Family History  Problem Relation Age of Onset  . Diabetes Mother   . Mental illness Father     bipolar  . Mental illness Brother     bipolar  . Mental illness Paternal Aunt     bipolar  . Heart disease Maternal Grandmother   . Asthma Maternal Grandmother   . Hypertension Maternal Grandfather   . Aortic aneurysm Maternal Grandfather   . Heart disease Paternal Grandfather     Allergies: No Known Allergies  Prescriptions prior to admission  Medication Sig Dispense Refill Last Dose  . Prenatal Vit-Fe Fumarate-FA (PRENATAL  MULTIVITAMIN) TABS tablet Take 1 tablet by mouth daily.   04/17/2015 at Unknown time  . butalbital-acetaminophen-caffeine (FIORICET) 50-325-40 MG per tablet Take 1-2 tablets by mouth every 6 (six) hours as needed for headache. (Patient not taking: Reported on 04/15/2015) 20 tablet 1 Not Taking at Unknown time    Review of Systems   All systems reviewed and negative except as stated in HPI  BP 121/80 mmHg  Pulse 81  Temp(Src) 97.8 F (36.6 C) (Oral)  Resp 16  Ht  (1.626 m)  Wt 153 lb (69.4 kg)  BMI 26.25 kg/m2  SpO2 97%  LMP 07/08/2014 General appearance: alert, cooperative and uncomfortable Lungs: clear to auscultation bilaterally Heart: regular rate and rhythm Abdomen: soft, non-tender; bowel sounds normal Pelvic: dilated 3 cm Extremities: no sign of DVT, edema Presentation: cephalic Fetal monitoringBaseline: 125 bpm, Variability: Good {> 6 bpm) and Accelerations: Reactive Uterine activityFrequency: Every 7-8 minutes and Duration: 80 seconds  Prenatal labs: ABO, Rh: --/--/B POS (08/24 1550) Antibody: NEG (08/24 1550) Rubella:  Immune RPR: Nonreactive (02/22 0000)  HBsAg: Negative (02/22 0000)  HIV: Non-reactive (02/22 0000)  GBS: Negative (07/28 0000)  1 ARPI [id number] Genetic screening: no abnormalities Anatomy US: no abnormalities   Prenatal Transfer Tool  Maternal Diabetes: No Genetic  Screening: Normal Maternal Ultrasounds/Referrals: Normal Fetal Ultrasounds or other Referrals:  None Maternal Substance Abuse:  No Significant Maternal Medications:  None Significant Maternal Lab Results: None  Results for orders placed or performed during the hospital encounter of 04/17/15 (from the past 24 hour(s))  CBC   Collection Time: 04/17/15  3:50 PM  Result Value Ref Range   WBC 10.3 4.0 - 10.5 K/uL   RBC 4.70 3.87 - 5.11 MIL/uL   Hemoglobin 12.8 12.0 - 15.0 g/dL   HCT 16.1 09.6 - 04.5 %   MCV 81.3 78.0 - 100.0 fL   MCH 27.2 26.0 - 34.0 pg   MCHC 33.5 30.0 -  36.0 g/dL   RDW 40.9 81.1 - 91.4 %   Platelets 244 150 - 400 K/uL  Type and screen   Collection Time: 04/17/15  3:50 PM  Result Value Ref Range   ABO/RH(D) B POS    Antibody Screen NEG    Sample Expiration 04/20/2015     Patient Active Problem List   Diagnosis Date Noted  . Normal delivery at term 04/17/2015    Assessment: Chelsea Rogers is a 19 y.o. G2P0010 at [redacted]w[redacted]d here for induction of labor due to non-reassuring BPP.  #Labor:Pitocin, expectant management #Pain: Labor support and PRN meds #FWB: Category 1 #ID:  GBS- #MOF: Breast #MOC:Undecided  Tarri Abernethy, MD PGY-1 [facility]   OB fellow attestation: I have seen and examined this patient; I agree with above documentation in the resident's note.   Chelsea Rogers is a 19 y.o. G2P0010 here for contractions and non reassuring BPP  PE: BP 134/75 mmHg  Pulse 64  Temp(Src) 97.8 F (36.6 C) (Oral)  Resp 16  Ht 5\' 4"  (1.626 m)  Wt 153 lb (69.4 kg)  BMI 26.25 kg/m2  SpO2 97%  LMP 07/08/2014 Gen: calm comfortable, NAD Resp: normal effort, no distress Abd: gravid  ROS, labs, PMH reviewed  Plan: #Labor: IOL for non reassuring BPP. Start pitocin.  #Pain: Labor support and PRN meds #FWB: Category 1 #ID:  GBS- #MOF: Breast #MOC:Undecided  Federico Flake, MD Family Medicine, OB Fellow 04/17/2015, 9:27 PM

## 2015-04-18 ENCOUNTER — Encounter (HOSPITAL_COMMUNITY): Payer: Self-pay | Admitting: *Deleted

## 2015-04-18 DIAGNOSIS — O480 Post-term pregnancy: Secondary | ICD-10-CM

## 2015-04-18 DIAGNOSIS — Z3A40 40 weeks gestation of pregnancy: Secondary | ICD-10-CM

## 2015-04-18 LAB — RPR: RPR Ser Ql: NONREACTIVE

## 2015-04-18 MED ORDER — ACETAMINOPHEN 325 MG PO TABS: 650.0000 mg | ORAL_TABLET | ORAL | Status: DC | PRN

## 2015-04-18 MED ORDER — WITCH HAZEL-GLYCERIN EX PADS: 1.0000 "application " | MEDICATED_PAD | CUTANEOUS | Status: DC | PRN

## 2015-04-18 MED ORDER — BENZOCAINE-MENTHOL 20-0.5 % EX AERO
1.0000 "application " | INHALATION_SPRAY | CUTANEOUS | Status: DC | PRN
  Administered 2015-04-18: 1 via TOPICAL
  Filled 2015-04-18 (×2): qty 56

## 2015-04-18 MED ORDER — OXYCODONE-ACETAMINOPHEN 5-325 MG PO TABS: 1.0000 | ORAL_TABLET | ORAL | Status: DC | PRN

## 2015-04-18 MED ORDER — OXYCODONE-ACETAMINOPHEN 5-325 MG PO TABS
2.0000 | ORAL_TABLET | ORAL | Status: DC | PRN
  Administered 2015-04-19: 2 via ORAL
  Filled 2015-04-18: qty 2

## 2015-04-18 MED ORDER — IBUPROFEN 600 MG PO TABS
600.0000 mg | ORAL_TABLET | Freq: Four times a day (QID) | ORAL | Status: DC
  Administered 2015-04-18 – 2015-04-19 (×5): 600 mg via ORAL
  Filled 2015-04-18 (×5): qty 1

## 2015-04-18 MED ORDER — PRENATAL MULTIVITAMIN CH
1.0000 | ORAL_TABLET | Freq: Every day | ORAL | Status: DC
  Administered 2015-04-18 – 2015-04-19 (×2): 1 via ORAL
  Filled 2015-04-18 (×2): qty 1

## 2015-04-18 MED ORDER — TETANUS-DIPHTH-ACELL PERTUSSIS 5-2.5-18.5 LF-MCG/0.5 IM SUSP
0.5000 mL | Freq: Once | INTRAMUSCULAR | Status: DC
  Filled 2015-04-18: qty 0.5

## 2015-04-18 MED ORDER — LANOLIN HYDROUS EX OINT: TOPICAL_OINTMENT | CUTANEOUS | Status: DC | PRN

## 2015-04-18 MED ORDER — SIMETHICONE 80 MG PO CHEW: 80.0000 mg | CHEWABLE_TABLET | ORAL | Status: DC | PRN

## 2015-04-18 MED ORDER — SODIUM BICARBONATE 8.4 % IV SOLN
INTRAVENOUS | Status: DC | PRN
  Administered 2015-04-18: 4 mL via EPIDURAL

## 2015-04-18 MED ORDER — ONDANSETRON HCL 4 MG/2ML IJ SOLN: 4.0000 mg | INTRAMUSCULAR | Status: DC | PRN

## 2015-04-18 MED ORDER — ONDANSETRON HCL 4 MG PO TABS: 4.0000 mg | ORAL_TABLET | ORAL | Status: DC | PRN

## 2015-04-18 MED ORDER — DIPHENHYDRAMINE HCL 25 MG PO CAPS: 25.0000 mg | ORAL_CAPSULE | Freq: Four times a day (QID) | ORAL | Status: DC | PRN

## 2015-04-18 MED ORDER — ZOLPIDEM TARTRATE 5 MG PO TABS: 5.0000 mg | ORAL_TABLET | Freq: Every evening | ORAL | Status: DC | PRN

## 2015-04-18 MED ORDER — BUPIVACAINE HCL (PF) 0.25 % IJ SOLN
INTRAMUSCULAR | Status: DC | PRN
  Administered 2015-04-18: 6 mL

## 2015-04-18 MED ORDER — SENNOSIDES-DOCUSATE SODIUM 8.6-50 MG PO TABS
2.0000 | ORAL_TABLET | ORAL | Status: DC
  Administered 2015-04-19: 2 via ORAL
  Filled 2015-04-18: qty 2

## 2015-04-18 MED ORDER — DIBUCAINE 1 % RE OINT
1.0000 "application " | TOPICAL_OINTMENT | RECTAL | Status: DC | PRN
  Filled 2015-04-18: qty 28

## 2015-04-18 NOTE — Progress Notes (Signed)
Labor Progress Note  Chelsea Rogers is a 19 y.o. G2P0010 at [redacted]w[redacted]d  admitted for induction of labor due to non-reassuring antenatal testing.  S: Patient more comfortable after epidural. Can no longer feel contractions or pain.   O:  BP 126/65 mmHg  Pulse 72  Temp(Src) 99.1 F (37.3 C) (Axillary)  Resp 16  Ht  (1.626 m)  Wt 153 lb (69.4 kg)  BMI 26.25 kg/m2  SpO2 100%  LMP 07/08/2014  FHT:  FHR: 140 bpm, variability: moderate,  accelerations:  Present,  decelerations:  Absent UC:   regular, every 1-3 minutes SVE:   Dilation: 10 Effacement (%): 100 Station: 0, +1 Exam by:: carmen blair rn   Labs: Lab Results  Component Value Date   WBC 10.3 04/17/2015   HGB 12.8 04/17/2015   HCT 38.2 04/17/2015   MCV 81.3 04/17/2015   PLT 244 04/17/2015    Assessment / Plan: 19 y.o. G2P0010 [redacted]w[redacted]d in active labor. Baby appears OP based on vaginal exam and bedside US.   Labor: Progressing normally; resume practice pushing Fetal Wellbeing:  Category I Pain Control:  Epidural; had to be redosed Anticipated MOD:  NSVD  Expectant management   Caryl Ada, DO 04/18/2015, 6:37 AM PGY-2, [facility]

## 2015-04-18 NOTE — Progress Notes (Signed)
Labor Progress Note  Chelsea Rogers is a 19 y.o. G2P0010 at [redacted]w[redacted]d  admitted for induction of labor due to non-reassuring antenatal testing.  S: Patient stating she is feeling more pelvic pressure. Nurse started pushing with patient.  O:  BP 131/81 mmHg  Pulse 69  Temp(Src) 98.2 F (36.8 C) (Oral)  Resp 16  Ht  (1.626 m)  Wt 153 lb (69.4 kg)  BMI 26.25 kg/m2  SpO2 100%  LMP 07/08/2014  FHT:  FHR: 140 bpm, variability: moderate,  accelerations:  Present,  decelerations:  Absent UC:   regular, every 1-3 minutes SVE:   Dilation: 10 Effacement (%): 100 Station: 0, +1 Exam by:: carmen blair rn   Labs: Lab Results  Component Value Date   WBC 10.3 04/17/2015   HGB 12.8 04/17/2015   HCT 38.2 04/17/2015   MCV 81.3 04/17/2015   PLT 244 04/17/2015    Assessment / Plan: 19 y.o. G2P0010 [redacted]w[redacted]d in active labor. Will do rest period before another trial of pushing.   Labor: Progressing normally Fetal Wellbeing:  Category I Pain Control:  Epidural Anticipated MOD:  NSVD  Expectant management   Caryl Ada, DO 04/18/2015, 4:51 AM PGY-2, [facility]

## 2015-04-18 NOTE — Plan of Care (Signed)
Problem: Consults Goal: Birthing Suites Patient Information Press F2 to bring up selections list Outcome: Completed/Met Date Met:  04/18/15  Pt > [redacted] weeks EGA     

## 2015-04-19 MED ORDER — IBUPROFEN 600 MG PO TABS: 600.0000 mg | ORAL_TABLET | Freq: Four times a day (QID) | ORAL | Status: DC

## 2015-04-19 NOTE — Discharge Summary (Signed)
Obstetric Discharge Summary Reason for Admission: onset of labor Prenatal Procedures: NST Intrapartum Procedures: spontaneous vaginal delivery Postpartum Procedures: none Complications-Operative and Postpartum: none  Delivery Note At 8:51 AM a viable female was delivered via Vaginal, Spontaneous Delivery (Presentation: Middle Occiput Anterior). APGAR: 8, 9; weight pending .  Placenta status: Intact, Spontaneous. Cord: 3 vessels with the following complications: None.   Anesthesia: Epidural  Episiotomy: None Lacerations: Labial Suture Repair: vicryl 4.0  Hospital Course: Active Problems:   Normal delivery at term   NSVD (normal spontaneous vaginal delivery)   Chelsea Rogers is a 19 y.o. G2P1011 s/p NSVD.  Patient was admitted for active labor.  She has postpartum course that was uncomplicated including no problems with ambulating, PO intake, urination, pain, or bleeding. The pt feels ready to go home and  will be discharged with outpatient follow-up.   Today: No acute events overnight.  Pt denies problems with ambulating, voiding or po intake.  She denies nausea or vomiting.  Pain is well controlled.  She has had flatus. She has not had bowel movement.  Lochia Minimal.  Plan for birth control is  Undecided.  Method of Feeding: Breast  Physical Exam:  General: alert and cooperative Lochia: appropriate Uterine Fundus: firm DVT Evaluation: No evidence of DVT seen on physical exam.  H/H: Lab Results  Component Value Date/Time   HGB 12.8 04/17/2015 03:50 PM   HCT 38.2 04/17/2015 03:50 PM    Discharge Diagnoses: Term Pregnancy-delivered  Discharge Information: Date: 04/19/2015 Activity: pelvic rest Diet: routine  Medications: Ibuprofen Breast feeding:  Yes Condition: stable Instructions: refer to handout Discharge to: home   Discharge Instructions    Call MD for:  persistant dizziness or light-headedness    Complete by:  As directed      Call MD for:  persistant  nausea and vomiting    Complete by:  As directed      Call MD for:  severe uncontrolled pain    Complete by:  As directed      Call MD for:  temperature >100.4    Complete by:  As directed      Diet general    Complete by:  As directed      Sexual acrtivity    Complete by:  As directed   Avoid anything in the vagina  for 6 weeks. If you do have sex please use condoms as your can become pregnant.            Medication List    TAKE these medications        butalbital-acetaminophen-caffeine 50-325-40 MG per tablet  Commonly known as:  FIORICET  Take 1-2 tablets by mouth every 6 (six) hours as needed for headache.     ibuprofen 600 MG tablet  Commonly known as:  ADVIL,MOTRIN  Take 1 tablet (600 mg total) by mouth every 6 (six) hours.     prenatal multivitamin Tabs tablet  Take 1 tablet by mouth daily.           Follow-up Information    Follow up with [facility] In 5 weeks.   Contact information:   [address] [id number] mc [address] [place] [address] [phone]      Federico Flake ,MD OB Fellow 04/19/2015,1:49 PM

## 2015-04-19 NOTE — Lactation Note (Addendum)
This note was copied from the chart of Chelsea Rogers. Lactation Consultation Note  Patient Name: Chelsea Adrean Findlay [id number] Date: 04/19/2015 Reason for consult: Initial assessment  Baby 27 hours old. Mom states that baby has been able to latch without NS. Enc mom to keep attempting to latch without, and discussed using NS if needed and then trying after a few minutes without. Discussed additional stimulation of breast with hand pump if using NS to support a good milk supply. Also discussed following baby's weight gain carefully while using NS and making sure baby transferring milk well through shield. Discussed scales at OP/BFSG. Mom states that she is comfortable with nursing baby. Enc mom to offer lots of STS and nurse with cues. Mom given [facility] brochure, aware of OP/BFSG, community resources, and [facility] phone line assistance after D/C. Referred parents to Baby and Me booklet for number of diapers to expect by day of life and EBM storage guidelines. Discussed engorgement prevention/treatment, and normal progression of milk coming to volume.  Maternal Data Has patient been taught Hand Expression?: Yes (Per mom.) Does the patient have breastfeeding experience prior to this delivery?: No  Feeding Feeding Type: Breast Fed Length of feed: 0 min (sleepy, unable to latch)  LATCH Score/Interventions                      Lactation Tools Discussed/Used     Consult Status Consult Status: PRN    Geralynn Ochs 04/19/2015, 11:58 AM

## 2015-04-19 NOTE — Progress Notes (Signed)
UR chart review completed.  

## 2015-04-19 NOTE — Anesthesia Postprocedure Evaluation (Signed)
  Anesthesia Post-op Note  Patient: Chelsea Rogers  Procedure(s) Performed: * No procedures listed *  Patient Location: Mother/Baby  Anesthesia Type:Epidural  Level of Consciousness: awake  Airway and Oxygen Therapy: Patient Spontanous Breathing  Post-op Pain: mild  Post-op Assessment: Patient's Cardiovascular Status Stable and Respiratory Function Stable              Post-op Vital Signs: stable  Last Vitals:  Filed Vitals:   04/19/15 0536  BP: 107/65  Pulse: 69  Temp: 36.4 C  Resp: 16    Complications: No apparent anesthesia complications

## 2015-04-19 NOTE — Progress Notes (Signed)
Post Partum Day 1 Subjective:  Chelsea Rogers is a 19 y.o. G2P1011 [redacted]w[redacted]d s/p NSVD.  No acute events overnight.  Pt denies problems with ambulating, voiding or po intake.  She denies nausea or vomiting.  Pain is well controlled.  She has had flatus. She has not had bowel movement.  Lochia Minimal.  Plan for birth control is Unsure- needs discussion, briefly addressed this AM.  Method of Feeding: breast  Objective: Blood pressure 107/65, pulse 69, temperature 97.5 F (36.4 C), temperature source Oral, resp. rate 16, height  (1.626 m), weight 153 lb (69.4 kg), last menstrual period 07/08/2014, SpO2 100 %, unknown if currently breastfeeding.  Physical Exam:  General: alert, cooperative and no distress Lochia:normal flow Chest: normal WOB Heart: RRR no m/r/g Abdomen: +BS, soft, nontender,  Uterine Fundus: firm DVT Evaluation: No evidence of DVT seen on physical exam. Extremities: no edema   Recent Labs  04/17/15 1550  HGB 12.8  HCT 38.2    Assessment/Plan:  ASSESSMENT: Chelsea Rogers is a 19 y.o. [id number] [redacted]w[redacted]d s/p NSVD at teram Routine PP care Breastfeeding support   Plan for discharge tomorrow   LOS: 2 days   Federico Flake 04/19/2015, 9:43 AM

## 2019-03-25 DEATH — deceased
